# Patient Record
Sex: Male | Born: 1955 | Race: Black or African American | Hispanic: No | Marital: Married | Smoking: Never smoker
Health system: Southern US, Community
[De-identification: ages and names within clinical notes are randomized; demographics above are authoritative.]

## PROBLEM LIST (undated history)

## (undated) DIAGNOSIS — R32 Unspecified urinary incontinence: Secondary | ICD-10-CM

## (undated) HISTORY — DX: Unspecified urinary incontinence: R32

---

## 2004-05-22 ENCOUNTER — Emergency Department (HOSPITAL_COMMUNITY): Admission: EM | Admit: 2004-05-22 | Discharge: 2004-05-22 | Payer: Self-pay | Admitting: Family Medicine

## 2007-03-14 ENCOUNTER — Emergency Department (HOSPITAL_COMMUNITY): Admission: EM | Admit: 2007-03-14 | Discharge: 2007-03-14 | Payer: Self-pay | Admitting: Emergency Medicine

## 2007-03-18 HISTORY — PX: PROSTATE SURGERY: SHX751

## 2007-04-05 ENCOUNTER — Encounter (INDEPENDENT_AMBULATORY_CARE_PROVIDER_SITE_OTHER): Payer: Self-pay | Admitting: Urology

## 2007-04-06 ENCOUNTER — Inpatient Hospital Stay (HOSPITAL_COMMUNITY): Admission: AD | Admit: 2007-04-06 | Discharge: 2007-04-08 | Payer: Self-pay | Admitting: Urology

## 2008-11-18 ENCOUNTER — Emergency Department (HOSPITAL_COMMUNITY): Admission: EM | Admit: 2008-11-18 | Discharge: 2008-11-18 | Payer: Self-pay | Admitting: Emergency Medicine

## 2009-02-09 IMAGING — CT CT PELVIS W/O CM
1 of 2 series · 15 of 32 positions shown, 19 images · IV contrast (agent unspecified)
Comparison: none

CLINICAL DATA: 51-year-old with back pain and bladder infection.  
ABDOMEN CT WITHOUT CONTRAST:
TECHNIQUE: Multidetector CT imaging of the abdomen was performed following the standard protocol without IV contrast.
TECHNIQUE: Multidetector CT imaging of the pelvis was performed following the standard protocol without IV contrast.

[Series 2: abd_pel 5.0 b40f st · axial · 0.73mm/px · z∈[-484,-54]mm · 15 of 94 slices shown, 19 images]
[im 4/94  soft-tissue]
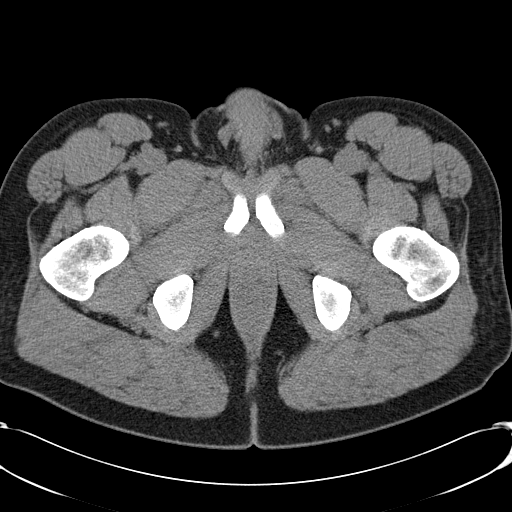
[im 4/94  bone]
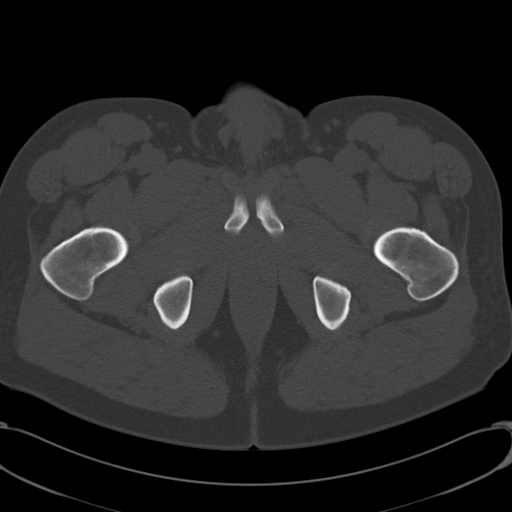
[im 11/94  soft-tissue]
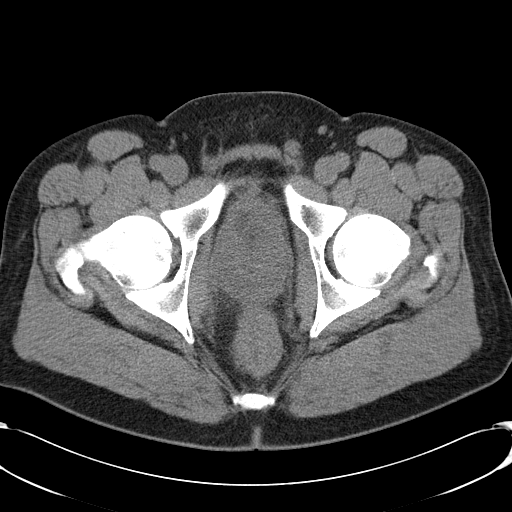
[im 18/94  soft-tissue]
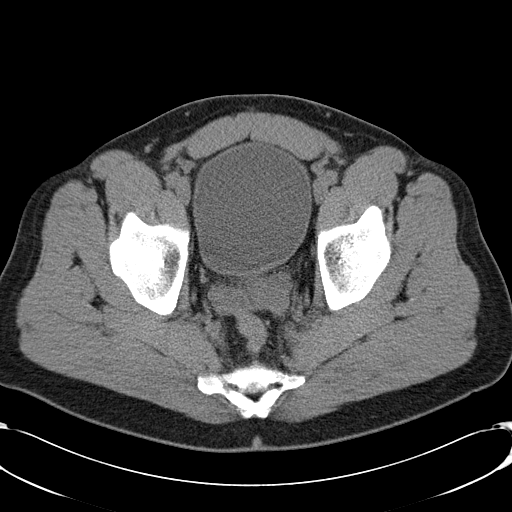
[im 26/94  soft-tissue]
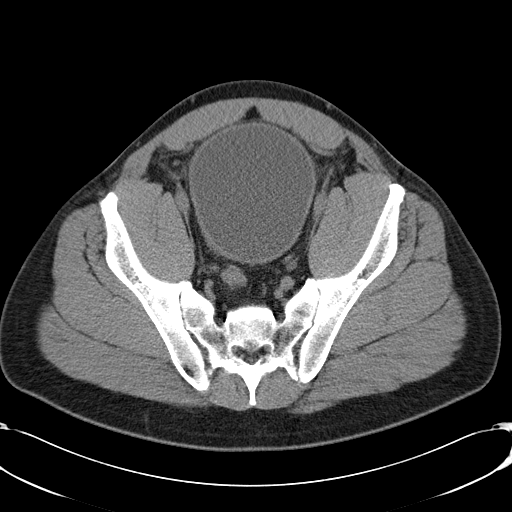
[im 33/94  soft-tissue]
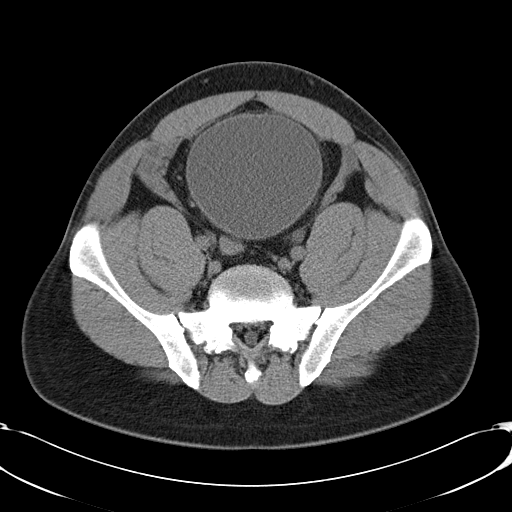
[im 40/94  soft-tissue]
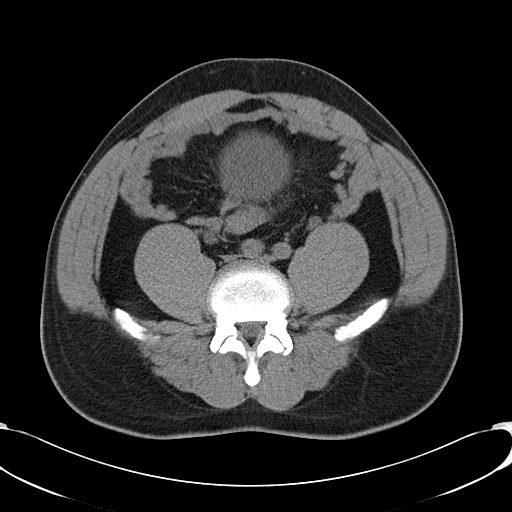
[im 47/94  soft-tissue]
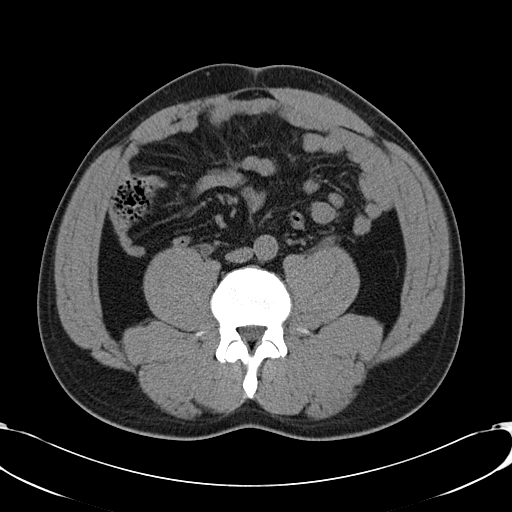
[im 54/94  soft-tissue]
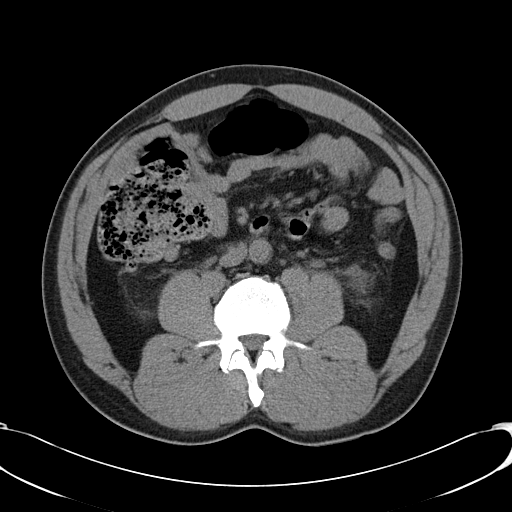
[im 61/94  soft-tissue]
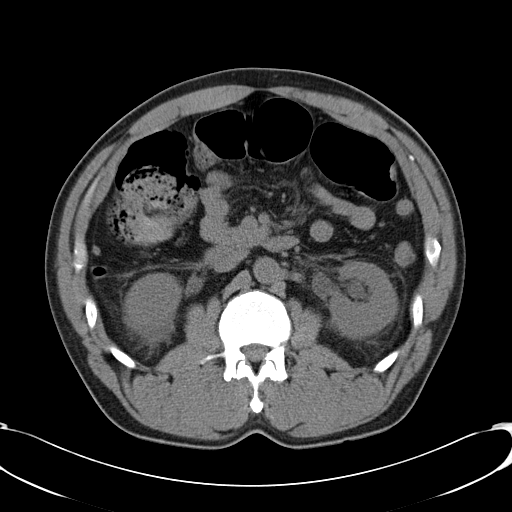
[im 61/94  bone]
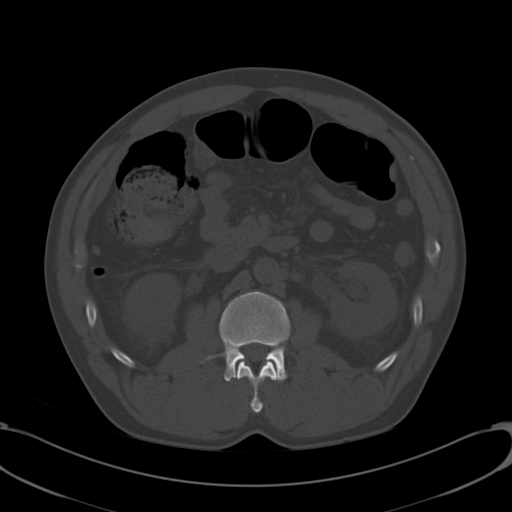
[im 68/94  soft-tissue]
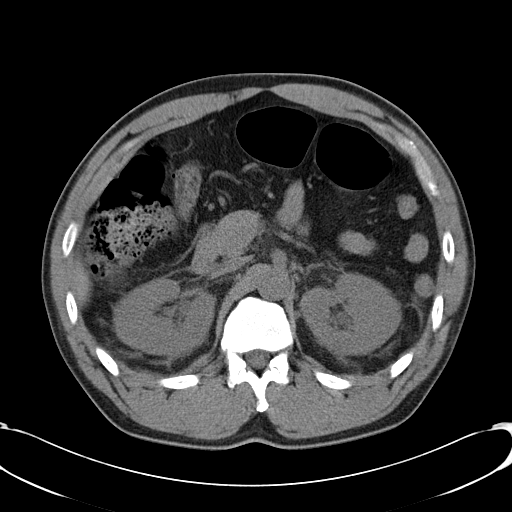
[im 76/94  soft-tissue]
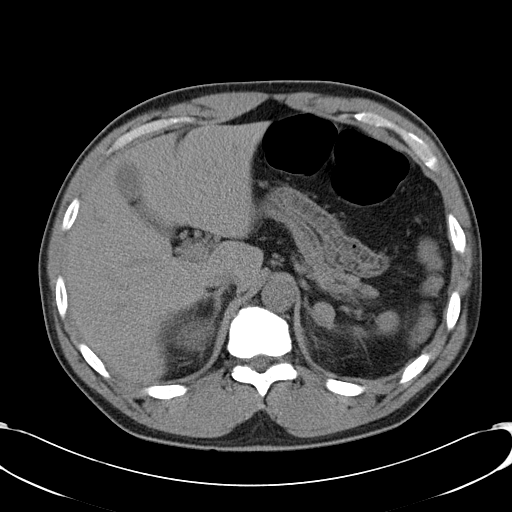
[im 79/94  lung]
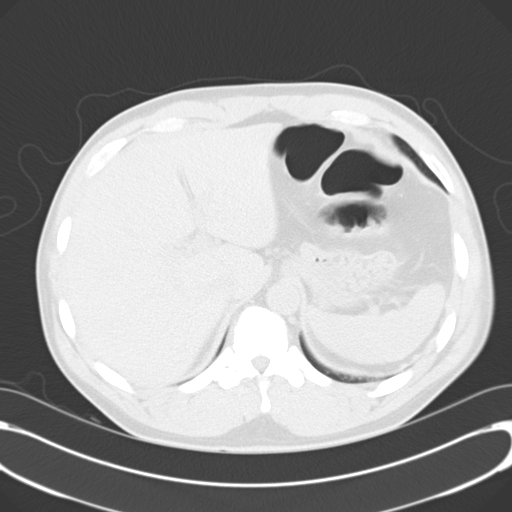
[im 83/94  soft-tissue]
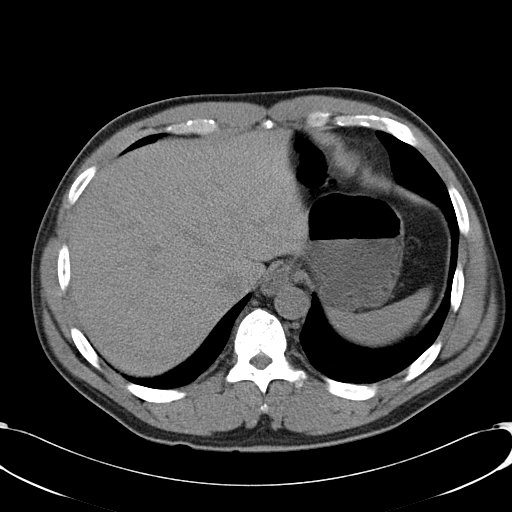
[im 83/94  lung]
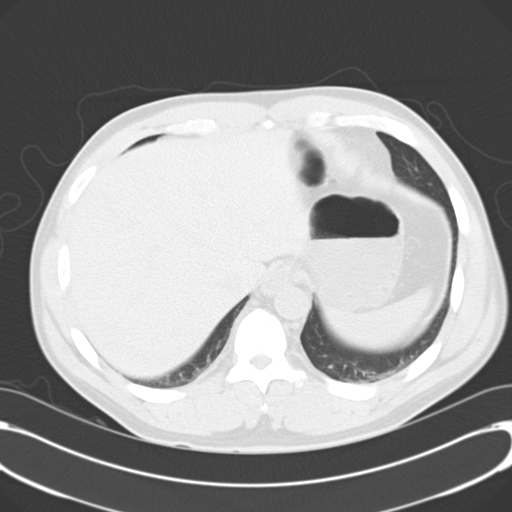
[im 86/94  lung]
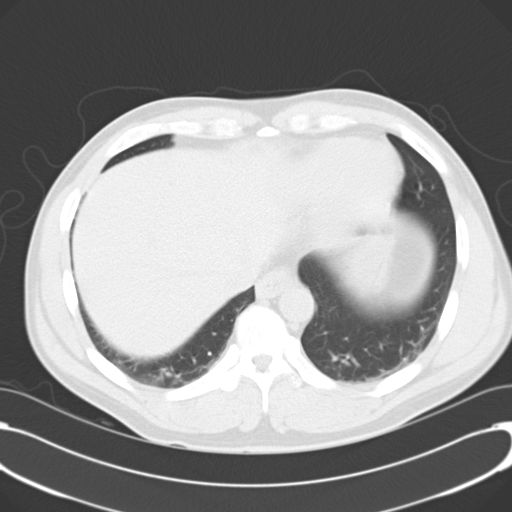
[im 90/94  soft-tissue]
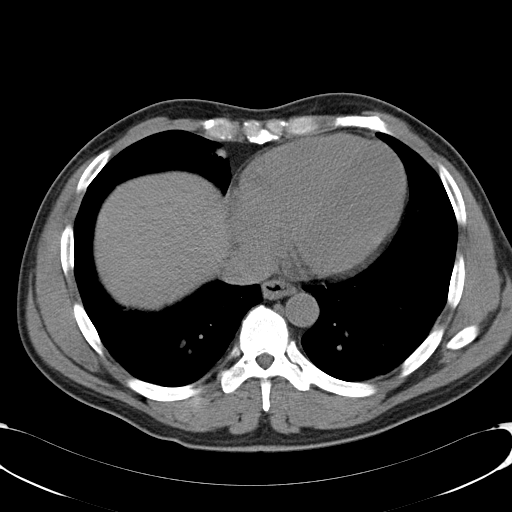
[im 90/94  lung]
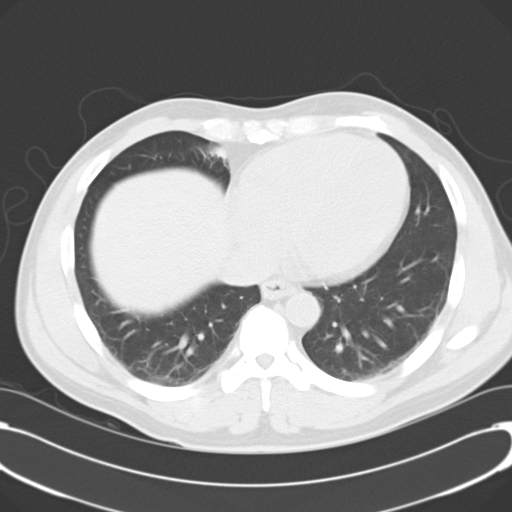

[15 of 32 positions shown; findings below may reference images not displayed]

FINDINGS: The lung bases are clear.  No evidence for free air.  The gallbladder, liver, spleen, stomach, and pancreas are within normal limits.  There is marked perinephric stranding bilaterally.   There is also mild dilatation of both ureters with periurethral stranding.  However, there are no definite renal stones.   Appendix has a normal morphology.  No significant free fluid or lymphadenopathy.  The urinary bladder is markedly distended and extends into the lower abdomen.  No acute bone abnormalities.
IMPRESSION: 1.  Bilateral perinephric stranding and mild hydroureteronephrosis associated with a distended urinary bladder. Findings are concerning for urinary retention and the patient may have secondary pyelonephritis. 
2.  No definite kidney stones 
PELVIS CT WITHOUT CONTRAST:
FINDINGS: Marked distention of the urinary bladder.  The prostate is very prominent measuring at least 5.4 cm in the axial dimension.  No significant free fluid.  No acute bone abnormalities.   There is sclerosis along the left side of the SI joint possibly degenerative in nature.
IMPRESSION: Distended urinary bladder concerning for bladder outlet obstruction possibly related to an enlarged prostate gland.

## 2010-06-21 LAB — POCT URINALYSIS DIP (DEVICE)
Hgb urine dipstick: NEGATIVE
Ketones, ur: NEGATIVE mg/dL
Specific Gravity, Urine: 1.02 (ref 1.005–1.030)
Urobilinogen, UA: 1 mg/dL (ref 0.0–1.0)
pH: 7 (ref 5.0–8.0)

## 2010-07-30 NOTE — H&P (Signed)
Tyler Austin, Tyler Austin              ACCOUNT NO.:  [id number]   MEDICAL RECORD NO.:  [id number]          PATIENT TYPE:  INP   LOCATION:  1436                         FACILITY:  [facility]   PHYSICIAN:  Martina Sinner, MD DATE OF BIRTH:  [birth year]   DATE OF ADMISSION:  04/05/2007  DATE OF DISCHARGE:                              HISTORY & PHYSICAL   ADMISSION DIAGNOSIS:  Urinary retention; admitted for postoperative pain  and bowel management.   HISTORY:  Mr. 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  On April 05, 2007, he underwent an open suprapubic prostatectomy by  Dr. Lorin Picket MacDiarmid and Dr. Marcine Matar.  Not surprising 1 day  after surgery, he was not passing gas and not ready for discharge home.  He was having some discomfort in the abdomen, but overall is doing  beautifully.   His serum creatinine when he first presented to the office was 1.6, but  his preoperative laboratory tests were within normal limits and his  serum creatinine is 1.2.   PAST MEDICAL HISTORY:  Healthy with no previous surgery.   MEDICATIONS:  He was treated with Levaquin prior to admission.   ALLERGIES:  NONE.   FAMILY HISTORY:  No history of prostate cancer.   SOCIAL HISTORY:  Works and lives locally.   REVIEW OF SYSTEMS:  Rest of the review of systems is negative.   PHYSICAL EXAMINATION:  GENERAL APPEARANCE:  Alert, well oriented, in no  distress.  RESPIRATORY:  Breaths quiet.  CARDIOVASCULAR:  Pulse regular.  ABDOMEN:  Soft and nontender.  GENITOURINARY:  Foley catheter draining to leg bag.  LYMPHATICS:  No palpable inguinal adenopathy.  NEUROLOGIC:  Normal sensation to touch.  MUSCULOSKELETAL:  Normal gait and arm mobility.  SKIN:  No obvious rashes.   IMPRESSION:  Benign prostatic hyperplasia with urinary retention and  bilateral hydroureter.  The patient had surgery on April 05, 2007 and  was  admitted on April 06, 2007 for one more day of observation to  treat pain, remove drain and for advancement of diet.  I suspect that  Mr. Franklin will be discharged home the following day.           ______________________________  Martina Sinner, MD  Electronically Signed     SAM/MEDQ  D:  04/06/2007  T:  04/06/2007  Job:  [id number]

## 2010-07-30 NOTE — Op Note (Signed)
Tyler Austin, Tyler Austin              ACCOUNT NO.:  [id number]   MEDICAL RECORD NO.:  [id number]          PATIENT TYPE:  OIB   LOCATION:  1436                         FACILITY:  [facility]   PHYSICIAN:  Martina Sinner, MD DATE OF BIRTH:  11/18/1955   DATE OF PROCEDURE:  04/05/2007  DATE OF DISCHARGE:                               OPERATIVE REPORT   PREOPERATIVE DIAGNOSIS:  Retention with bilateral hydronephrosis and  benign prostatic hyperplasia.   POSTOPERATIVE DIAGNOSIS:  Retention with bilateral hydronephrosis and  benign prostatic hyperplasia.   SURGERY:  Suprapubic prostatectomy plus insertion of suprapubic tube.   SURGEON:  Martina Sinner, MD.   ASSISTANTBertram Millard. Dahlstedt, M.D.   INDICATION:  Mr. Opara history has been previously well documented.  His serum creatinine has decreased from 1.6 to 1.2 which was excellent.  His preoperative laboratory tests demonstrated a PSA of 2.68.  I spoke  to Dr. Retta Diones and Dr. Vonita Moss as well as the patient in this regard.  We felt that there was enough evidence to support the benign nature of  this value.  This was discussed with Mr. Rotundo and the family and we  went ahead with the open prostatectomy.   DESCRIPTION OF PROCEDURE:  The patient was prepped and draped in the  usual fashion.  He had a long suprapubic area.  A low midline incision  was made from the symphysis pubis to approximately 1 inch from the  umbilicus.  I dissected down splitting the rectus muscle in the midline.  The retropubic space was entered.  A new 24-French Foley catheter was  placed into the bladder under sterile conditions and the bladder was  filled with saline elevating the bladder.  The bladder was opened in the  midline for approximately 3 inches and six 3-0 silk pop-off sutures were  utilized.  A Bookwalter retractor with a blade in the dome of the  bladder with 2 sponges was utilized to retract the bladder and identify  the adenoma.  The  adenoma was easily visualized at the bladder neck and  extending into the neck of the bladder.  The ureteral orifices were  approximately 2 cm from the bladder neck and were visualized throughout  the length of the case.   Using good countertraction, a circular incision was made with cautery,  allowing [place] to find the plane between the prostatic capsule and the  adenoma.  It took approximately 10 and possibly even 15 minutes to  carefully dissect out the adenoma.  He came out in 3 pieces.  At times  some Metzenbaum dissection and even cautery dissection was done in a  very controlled fashion to remove the adenoma at that its interface with  the prostatic capsule and tissue.  A pinch maneuver was used at the  urethra.  Fortunately, bleeding was minimal during this part of the  dissection.   After irrigating the prostatic fossa and using good retraction with a  sponge forceps and narrow malleable, we were very happy that all the  adenoma had been removed.  We were both a little bit surprised that  there was not as much tissue as what was identified on prostatic  ultrasound.  There was not a lot of bleeding in the bed of the prostatic  fossa, though I placed a figure-of-eight 2-0 Vicryl at 5 and 7 o'clock  and there is no question this improved further the hemostasis.  Some  bleeding in the midline was fulgurated.  The prostatic bed was almost  completely dry at this point.   A 24-French catheter was inserted in the bladder.  At the end of the  case approximately 40 mL of sterile water was instilled into the  balloon.   A 20-French suprapubic catheter was brought through a small incision and  through the rectus belly on the right side.  It was brought through the  right side of the bladder wall away from the midline incision.  A 2-0  Vicryl pursestring suture was utilized.  The bladder wall was  exceptionally thick, at least 1 cm or thicker in the midline.  Sterile  water 10 mL was  inserted into the suprapubic tube.   On the left side a similar procedure allowed [place] to bring through a 10-  [place] drain.  This was placed in the retropubic space.   The bladder was closed with running 2-0 Vicryl suture in a 2-layer  fashion.  We were very happy with the closure.  The bladder was very  dry.  I instilled saline through the urethral catheter and the closure  was watertight.   Sponge count was good, and therefore we closed the fascia with running  #1 PDS.  The skin was closed with staples.  A 2-0 silk was used for the  drain and for the suprapubic tube.   At the end of the case normal saline irrigation was hooked up to the  suprapubic catheter and it was draining nicely through the urethral  catheter.  Irrigation was also utilized to make certain all tubes were  draining well.  The drain was set in place.  The catheters were secured.  Blood loss was estimated be approximately 200 mL.  Mr. Pickard was very  stable.   I am very hopeful that Mr. Bisson will greatly benefit from this  operation and he will start voiding and that we can protect his upper  tracts long term.           ______________________________  Martina Sinner, MD  Electronically Signed     SAM/MEDQ  D:  04/05/2007  T:  04/06/2007  Job:  [id number]

## 2010-07-30 NOTE — Discharge Summary (Signed)
Tyler Austin, Tyler Austin              ACCOUNT NO.:  [id number]   MEDICAL RECORD NO.:  [id number]          PATIENT TYPE:  INP   LOCATION:  1436                         FACILITY:  [facility]   PHYSICIAN:  Martina Sinner, MD DATE OF BIRTH:  Dec 11, 1955   DATE OF ADMISSION:  04/05/2007  DATE OF DISCHARGE:  04/07/2007                               DISCHARGE SUMMARY   ADMISSION DIAGNOSIS:  Urinary retention with bilateral hydronephrosis  and urinary retention; admitted for postoperative pain.   HOSPITAL COURSE:  [name] had an open suprapubic  prostatectomy on April 05, 2007.  He was initially in the hospital for  23 hours observation.  Not surprisingly, he required another day in the  hospital for pain management and advancement of his diet.   [name] was slow to have a bowel movement.  He was finally given a  Dulcolax suppository.  He had a little bit of belching on day one, but  was eating breakfast on day number two post surgery.  He was afebrile.  His vitals were stable throughout his course.  He had little to no  postoperative bleeding.  His hemoglobin was stable.  His incision looked  great.  His abdomen was soft and flat.   On the day of discharge, I stopped his CBI.  We took out his IV.  The  suprapubic catheter was clamped off with a catheter plug.  He was given  a leg and night bag and instructions.  He was sent home with Levaquin  for 7 days, MiraLax and Colace on a p.r.n. basis, and Vicodin.  He will  also continue on his Flomax.   His hemoglobin was 11.1 on April 06, 2007.  His PSA was 2.14 on  April 05, 2007.  His serum creatinine was 1.2 and electrolytes were  normal on April 05, 2007.   DISCHARGE MEDICATIONS:  1. Flomax 0.4 mg daily.  2. MiraLax once daily p.r.n.  3. Colace 100 mg twice a day p.r.n.  4. Levaquin 750 mg a day x7 days.  (The patient had a prescription at      home).   Mr. Frith and I reviewed his postoperative activity.  One week  after  discharge, I will remove his Foley catheter and give him a trial of  voiding.  I will also remove his skin staples.           ______________________________  Martina Sinner, MD  Electronically Signed     SAM/MEDQ  D:  04/07/2007  T:  04/07/2007  Job:  [id number]

## 2010-12-05 LAB — CBC
HCT: 31.5 — ABNORMAL LOW
HCT: 34.9 — ABNORMAL LOW
Hemoglobin: 11.3 — ABNORMAL LOW
MCHC: 33.6
MCHC: 34.5
MCHC: 35.2
MCV: 88.1
Platelets: 427 — ABNORMAL HIGH
Platelets: 442 — ABNORMAL HIGH
RBC: 4.02 — ABNORMAL LOW
RDW: 15
WBC: 10.3

## 2010-12-05 LAB — TYPE AND SCREEN: Antibody Screen: NEGATIVE

## 2010-12-05 LAB — BASIC METABOLIC PANEL
BUN: 10
GFR calc Af Amer: >60
Glucose, Bld: 93
Sodium: 141

## 2010-12-05 LAB — PSA: PSA: 2.14

## 2010-12-05 LAB — GENTAMICIN LEVEL, RANDOM: Gentamicin Rm: 4.3

## 2010-12-05 LAB — PROTIME-INR: INR: 0.9

## 2010-12-20 LAB — URINE CULTURE
Colony Count: NO GROWTH
Culture: NO GROWTH

## 2010-12-20 LAB — DIFFERENTIAL
Lymphs Abs: 0.4 — ABNORMAL LOW
Monocytes Absolute: 0.6
Neutro Abs: 10.1 — ABNORMAL HIGH
Neutrophils Relative %: 90 — ABNORMAL HIGH

## 2010-12-20 LAB — URINALYSIS, ROUTINE W REFLEX MICROSCOPIC
Bilirubin Urine: NEGATIVE
Glucose, UA: NEGATIVE
Nitrite: POSITIVE — AB
Specific Gravity, Urine: 1.016
Urobilinogen, UA: 1
pH: 5

## 2010-12-20 LAB — CBC
Hemoglobin: 13.5
WBC: 11.1 — ABNORMAL HIGH

## 2010-12-20 LAB — COMPREHENSIVE METABOLIC PANEL
AST: 28
Albumin: 4.2
Alkaline Phosphatase: 56
BUN: 15
CO2: 26
Creatinine, Ser: 1.61 — ABNORMAL HIGH
GFR calc non Af Amer: 46 — ABNORMAL LOW
Glucose, Bld: 124 — ABNORMAL HIGH
Total Bilirubin: 1

## 2011-08-25 ENCOUNTER — Ambulatory Visit (INDEPENDENT_AMBULATORY_CARE_PROVIDER_SITE_OTHER): Payer: BC Managed Care – PPO | Admitting: Family

## 2011-08-25 ENCOUNTER — Encounter: Payer: Self-pay | Admitting: Family

## 2011-08-25 VITALS — BP 120/90 | HR 78 | Ht 74.0 in | Wt 212.0 lb

## 2011-08-25 DIAGNOSIS — Z23 Encounter for immunization: Secondary | ICD-10-CM

## 2011-08-25 DIAGNOSIS — Z0000 Encounter for general adult medical examination without abnormal findings: Secondary | ICD-10-CM

## 2011-08-25 LAB — LIPID PANEL
Cholesterol: 164 mg/dL (ref 0–200)
LDL Cholesterol: 111 mg/dL — ABNORMAL HIGH (ref 0–99)
Total CHOL/HDL Ratio: 4
Triglycerides: 44 mg/dL (ref 0.0–149.0)
VLDL: 8.8 mg/dL (ref 0.0–40.0)

## 2011-08-25 LAB — BASIC METABOLIC PANEL
GFR: 92.25 mL/min (ref >60.00)
Potassium: 4.1 mEq/L (ref 3.5–5.1)

## 2011-08-25 LAB — POCT URINALYSIS DIPSTICK
Bilirubin, UA: NEGATIVE
Ketones, UA: NEGATIVE
Protein, UA: NEGATIVE
pH, UA: 5.5

## 2011-08-25 LAB — CBC WITH DIFFERENTIAL/PLATELET
Basophils Absolute: 0 10*3/uL (ref 0.0–0.1)
Eosinophils Relative: 3.7 % (ref 0.0–5.0)
HCT: 40 % (ref 39.0–52.0)
Hemoglobin: 13.4 g/dL (ref 13.0–17.0)
Lymphocytes Relative: 30.4 % (ref 12.0–46.0)
MCHC: 33.3 g/dL (ref 30.0–36.0)
Monocytes Absolute: 0.3 10*3/uL (ref 0.1–1.0)
Neutro Abs: 2.4 10*3/uL (ref 1.4–7.7)
Neutrophils Relative %: 57.8 % (ref 43.0–77.0)
WBC: 4.2 10*3/uL — ABNORMAL LOW (ref 4.5–10.5)

## 2011-08-25 NOTE — Patient Instructions (Signed)
Exercise to Stay Healthy  Exercise helps you become and stay healthy.    EXERCISE IDEAS AND TIPS  Choose exercises that:   You enjoy.   Fit into your day.  You do not need to exercise really hard to be healthy. You can do exercises at a slow or medium level and stay healthy. You can:   Stretch before and after working out.   Try yoga, Pilates, or tai chi.   Lift weights.   Walk fast, swim, jog, run, climb stairs, bicycle, dance, or rollerskate.   Take aerobic classes.    Exercises that burn about 150 calories:     Running 1  miles in 15 minutes.   Playing volleyball for 45 to 60 minutes.   Washing and waxing a car for 45 to 60 minutes.   Playing touch football for 45 minutes.   Walking 1  miles in 35 minutes.   Pushing a stroller 1  miles in 30 minutes.   Playing basketball for 30 minutes.   Raking leaves for 30 minutes.   Bicycling 5 miles in 30 minutes.   Walking 2 miles in 30 minutes.   Dancing for 30 minutes.   Shoveling snow for 15 minutes.   Swimming laps for 20 minutes.   Walking up stairs for 15 minutes.   Bicycling 4 miles in 15 minutes.   Gardening for 30 to 45 minutes.   Jumping rope for 15 minutes.   Washing windows or floors for 45 to 60 minutes.  Document Released: 04/05/2010 Document Revised: 02/20/2011 Document Reviewed: 04/05/2010  [employer] Patient Information 2012 [employer].

## 2011-08-25 NOTE — Progress Notes (Signed)
  Subjective:    Patient ID: Tyler Austin, male    DOB: 07-15-1955, 56 y.o.   MRN: [id number]  HPI  Patient presents for yearly preventative medicine examination. All immunizations and health maintenance protocols were reviewed with the patient and they are up to date with these protocols. Screening laboratory values were reviewed with the patient including screening of hyperlipidemia PSA renal function and hepatic function.There medications past medical history social history problem list and allergies were reviewed in detail. Goals were established with regard to weight loss exercise diet in compliance with medications   Review of Systems  Constitutional: Negative.   HENT: Negative.   Eyes: Negative.   Respiratory: Negative.   Cardiovascular: Negative.   Gastrointestinal: Negative.   Genitourinary: Negative.   Musculoskeletal: Negative.   Neurological: Negative.   Hematological: Negative.   Psychiatric/Behavioral: Negative.    Past Medical History  Diagnosis Date  . Urinary incontinence     History   Social History  . Marital Status: Married    Spouse Name: N/A    Number of Children: N/A  . Years of Education: N/A   Occupational History  . Not on file.   Social History Main Topics  . Smoking status: Never Smoker   . Smokeless tobacco: Not on file  . Alcohol Use: No  . Drug Use: No  . Sexually Active: Not on file   Other Topics Concern  . Not on file   Social History Narrative  . No narrative on file    Past Surgical History  Procedure Date  . Prostate surgery 2009    Family History  Problem Relation Age of Onset  . Hypertension Mother     No Known Allergies  Current Outpatient Prescriptions on File Prior to Visit  Medication Sig Dispense Refill  . calcium carbonate (OS-CAL) 600 MG TABS Take 600 mg by mouth daily.        BP 120/90  Pulse 78  Ht 6\' 2"  (1.88 m)  Wt 212 lb (96.163 kg)  BMI 27.22 kg/m2  SpO2 98%chart    Objective:   Physical  Exam  Constitutional: He is oriented to person, place, and time. He appears well-developed and well-nourished.  HENT:  Head: Normocephalic and atraumatic.  Right Ear: External ear normal.  Left Ear: External ear normal.  Nose: Nose normal.  Mouth/Throat: Oropharynx is clear and moist.  Eyes: Conjunctivae and EOM are normal. Pupils are equal, round, and reactive to light.  Neck: Normal range of motion. Neck supple.  Cardiovascular: Normal rate, regular rhythm, normal heart sounds and intact distal pulses.   Pulmonary/Chest: Effort normal and breath sounds normal.  Abdominal: Soft. Bowel sounds are normal.  Genitourinary: Rectum normal and penis normal. Guaiac negative stool. No penile tenderness.  Musculoskeletal: Normal range of motion.  Neurological: He is alert and oriented to person, place, and time. He has normal reflexes.  Skin: Skin is warm and dry.  Psychiatric: He has a normal mood and affect.          Assessment & Plan:  Assessment: CPX  Plan: Labs sent to include BMP, CBC, PSA,lipids, will notify patient pending results. Healthy diet and exercise. Continue vitamin supplements.

## 2011-09-01 ENCOUNTER — Encounter: Payer: Self-pay | Admitting: Internal Medicine

## 2011-09-30 ENCOUNTER — Ambulatory Visit (AMBULATORY_SURGERY_CENTER): Payer: BC Managed Care – PPO

## 2011-09-30 ENCOUNTER — Encounter: Payer: Self-pay | Admitting: Internal Medicine

## 2011-09-30 VITALS — Ht 74.0 in | Wt 210.4 lb

## 2011-09-30 DIAGNOSIS — Z1211 Encounter for screening for malignant neoplasm of colon: Secondary | ICD-10-CM

## 2011-09-30 MED ORDER — MOVIPREP 100 G PO SOLR: ORAL | Status: DC

## 2011-10-14 ENCOUNTER — Ambulatory Visit (AMBULATORY_SURGERY_CENTER): Payer: BC Managed Care – PPO | Admitting: Internal Medicine

## 2011-10-14 ENCOUNTER — Encounter: Payer: Self-pay | Admitting: Internal Medicine

## 2011-10-14 VITALS — BP 126/83 | HR 48 | Temp 97.0°F | Resp 20 | Ht 74.0 in | Wt 212.0 lb

## 2011-10-14 DIAGNOSIS — Z1211 Encounter for screening for malignant neoplasm of colon: Secondary | ICD-10-CM

## 2011-10-14 MED ORDER — SODIUM CHLORIDE 0.9 % IV SOLN: 500.0000 mL | INTRAVENOUS | Status: DC

## 2011-10-14 NOTE — Progress Notes (Signed)
Pt's heart rate in the 40's before procedure begins.  He states he plays basketball several times a week.  Robinul 0.2mg  IV given per [facility] CRNA  Propofol given and oxygen managed per [facility] CRNA

## 2011-10-14 NOTE — Progress Notes (Signed)
Patient did not experience any of the following events: a burn prior to discharge; a fall within the facility; wrong site/side/patient/procedure/implant event; or a hospital transfer or hospital admission upon discharge from the facility. (G8907) Patient did not have preoperative order for IV antibiotic SSI prophylaxis. (G8918)  

## 2011-10-14 NOTE — Op Note (Signed)
[facility] [address] [place], [place]  [address]  COLONOSCOPY PROCEDURE REPORT  PATIENT:  Tyler, Austin  MR#:  [id number] BIRTHDATE:  November 02, 1955, 56 yrs. old  GENDER:  male ENDOSCOPIST:  Iva Boop, MD, [facility] REF BY:          Adline Mango, NP PROCEDURE DATE:  10/14/2011 PROCEDURE:  Colonoscopy 62130 ASA CLASS:  Class I INDICATIONS:  Routine Risk Screening MEDICATIONS:   These medications were titrated to patient response per physician's verbal order, MAC sedation, administered by CRNA, propofol (Diprivan) 400 mg IV  DESCRIPTION OF PROCEDURE:   After the risks benefits and alternatives of the procedure were thoroughly explained, informed consent was obtained.  Digital rectal exam was performed and revealed no rectal masses and that the prostate was surgically absent.   The LB CF-[id number] endoscope was introduced through the anus and advanced to the cecum, which was identified by both the appendix and ileocecal valve, without limitations. The quality of the prep was adequate, using MoviPrep.  The instrument was then slowly withdrawn as the colon was fully examined. <<PROCEDUREIMAGES>>  FINDINGS:  A normal appearing cecum, ileocecal valve, and appendiceal orifice were identified. The ascending, hepatic flexure, transverse, splenic flexure, descending, sigmoid colon, and rectum appeared unremarkable.   Retroflexed views in the rectum revealed no abnormalities.    The time to cecum = 3:55 minutes. The scope was then withdrawn in 12:53 minutes from the cecum and the procedure completed. COMPLICATIONS:  None ENDOSCOPIC IMPRESSION: 1) Normal colonoscopy RECOMMENDATIONS:  REPEAT EXAM:  In 10 year(s) for routine screening colonoscopy.  Iva Boop, MD, [facility]  CC:  Adline Mango NP and The Patient  n. eSIGNED:   Iva Boop at 10/14/2011 09:31 AM  Chrys Racer, [phone]

## 2011-10-14 NOTE — Patient Instructions (Addendum)
The colon was normal! No polyps were found.   Thank you for choosing me and [facility].  Iva Boop, MD, FACG  YOU HAD AN ENDOSCOPIC PROCEDURE TODAY AT THE [facility]: Refer to the procedure report that was given to you for any specific questions about what was found during the examination.  If the procedure report does not answer your questions, please call your gastroenterologist to clarify.  If you requested that your care partner not be given the details of your procedure findings, then the procedure report has been included in a sealed envelope for you to review at your convenience later.  YOU SHOULD EXPECT: Some feelings of bloating in the abdomen. Passage of more gas than usual.  Walking can help get rid of the air that was put into your GI tract during the procedure and reduce the bloating. If you had a lower endoscopy (such as a colonoscopy or flexible sigmoidoscopy) you may notice spotting of blood in your stool or on the toilet paper. If you underwent a bowel prep for your procedure, then you may not have a normal bowel movement for a few days.  DIET: Your first meal following the procedure should be a light meal and then it is ok to progress to your normal diet.  A half-sandwich or bowl of soup is an example of a good first meal.  Heavy or fried foods are harder to digest and may make you feel nauseous or bloated.  Likewise meals heavy in dairy and vegetables can cause extra gas to form and this can also increase the bloating.  Drink plenty of fluids but you should avoid alcoholic beverages for 24 hours.  ACTIVITY: Your care partner should take you home directly after the procedure.  You should plan to take it easy, moving slowly for the rest of the day.  You can resume normal activity the day after the procedure however you should NOT DRIVE or use heavy machinery for 24 hours (because of the sedation medicines used during the test).    SYMPTOMS TO REPORT  IMMEDIATELY: A gastroenterologist can be reached at any hour.  During normal business hours, 8:30 AM to 5:00 PM Monday through Friday, call [phone].  After hours and on weekends, please call the GI answering service at [phone] who will take a message and have the physician on call contact you.   Following lower endoscopy (colonoscopy or flexible sigmoidoscopy):  Excessive amounts of blood in the stool  Significant tenderness or worsening of abdominal pains  Swelling of the abdomen that is new, acute  Fever of 100F or highER   FOLLOW UP: If any biopsies were taken you will be contacted by phone or by letter within the next 1-3 weeks.  Call your gastroenterologist if you have not heard about the biopsies in 3 weeks.  Our staff will call the home number listed on your records the next business day following your procedure to check on you and address any questions or concerns that you may have at that time regarding the information given to you following your procedure. This is a courtesy call and so if there is no answer at the home number and we have not heard from you through the emergency physician on call, we will assume that you have returned to your regular daily activities without incident.  SIGNATURES/CONFIDENTIALITY: You and/or your care partner have signed paperwork which will be entered into your electronic medical record.  These signatures attest to  the fact that that the information above on your After Visit Summary has been reviewed and is understood.  Full responsibility of the confidentiality of this discharge information lies with you and/or your care-partner.   NORMAL EXAM TODAY. REPEAT EXAM IN 10 YEARS-2023

## 2011-10-15 ENCOUNTER — Telehealth: Payer: Self-pay | Admitting: *Deleted

## 2011-10-15 NOTE — Telephone Encounter (Signed)
  Follow up Call-  Call back number 10/14/2011  Post procedure Call Back phone  # [phone]  Permission to leave phone message Yes     Patient questions:  Do you have a fever, pain , or abdominal swelling? no Pain Score  0 *  Have you tolerated food without any problems? yes  Have you been able to return to your normal activities? yes  Do you have any questions about your discharge instructions: Diet   no Medications  no Follow up visit  no  Do you have questions or concerns about your Care? no  Actions: * If pain score is 4 or above: No action needed, pain <4.

## 2015-07-15 ENCOUNTER — Emergency Department (HOSPITAL_COMMUNITY)
Admission: EM | Admit: 2015-07-15 | Discharge: 2015-07-15 | Disposition: A | Discharge status: Home / Self Care | Payer: Self-pay | Attending: Emergency Medicine | Admitting: Emergency Medicine

## 2015-07-15 ENCOUNTER — Emergency Department (HOSPITAL_COMMUNITY): Payer: Self-pay

## 2015-07-15 ENCOUNTER — Encounter (HOSPITAL_COMMUNITY): Payer: Self-pay | Admitting: *Deleted

## 2015-07-15 DIAGNOSIS — W2105XA Struck by basketball, initial encounter: Secondary | ICD-10-CM | POA: Insufficient documentation

## 2015-07-15 DIAGNOSIS — Y998 Other external cause status: Secondary | ICD-10-CM | POA: Insufficient documentation

## 2015-07-15 DIAGNOSIS — T148XXA Other injury of unspecified body region, initial encounter: Secondary | ICD-10-CM

## 2015-07-15 DIAGNOSIS — Z79899 Other long term (current) drug therapy: Secondary | ICD-10-CM | POA: Insufficient documentation

## 2015-07-15 DIAGNOSIS — Y92310 Basketball court as the place of occurrence of the external cause: Secondary | ICD-10-CM | POA: Insufficient documentation

## 2015-07-15 DIAGNOSIS — S62617A Displaced fracture of proximal phalanx of left little finger, initial encounter for closed fracture: Secondary | ICD-10-CM | POA: Insufficient documentation

## 2015-07-15 DIAGNOSIS — Y9367 Activity, basketball: Secondary | ICD-10-CM | POA: Insufficient documentation

## 2015-07-15 MED ORDER — LIDOCAINE HCL 2 % IJ SOLN
10.0000 mL | Freq: Once | INTRAMUSCULAR | Status: AC
  Administered 2015-07-15: 200 mg
  Filled 2015-07-15: qty 20

## 2015-07-15 MED ORDER — OXYCODONE-ACETAMINOPHEN 5-325 MG PO TABS: 1.0000 | ORAL_TABLET | Freq: Four times a day (QID) | ORAL | Status: DC | PRN

## 2015-07-15 NOTE — ED Notes (Signed)
Pt is here with left pinkie finger swelling from a basketball injury today.  Pt can bend it

## 2015-07-15 NOTE — ED Provider Notes (Signed)
CSN: [id number]     Arrival date & time 07/15/15  1205 History  By signing my name below, I, Soijett Blue, attest that this documentation has been prepared under the direction and in the presence of S. Lane Hacker, PA-C Electronically Signed: Soijett Blue, ED Scribe. 07/15/2015. 1:02 PM.    Chief Complaint  Patient presents with  . Finger Injury      The history is provided by the patient. No language interpreter was used.    Tyler Austin is a 60 y.o. male who presents to the Emergency Department complaining of left little finger injury occurring today 1.5 hours PTA. Pt states that he was playing basketball when he caught the ball and it struck his left little finger. Pt reports that he had another individual to attempt to pull the finger back into place with no success. Pt denies pain at this time. Pt is having associated symptoms of left little swelling. He notes that he has tried ice without any medications for the relief of his symptoms. He denies color change, wound, rash, numbness, tingling, and any other symptoms.    Past Medical History  Diagnosis Date  . Urinary incontinence    Past Surgical History  Procedure Laterality Date  . Prostate surgery  2009   Family History  Problem Relation Age of Onset  . Hypertension Mother   . Breast cancer Sister    Social History  Substance Use Topics  . Smoking status: Never Smoker   . Smokeless tobacco: Never Used  . Alcohol Use: No    Review of Systems  A complete 10 system review of systems was obtained and all systems are negative except as noted in the HPI and PMH.   Allergies  Review of patient's allergies indicates no known allergies.  Home Medications   Prior to Admission medications   Medication Sig Start Date End Date Taking? Authorizing Provider  calcium carbonate (OS-CAL) 600 MG TABS Take 600 mg by mouth daily.    Historical Provider, MD  glucosamine-chondroitin 500-400 MG tablet Take 1 tablet by mouth daily.     Historical Provider, MD  Multiple Vitamin (MULTIVITAMIN) tablet Take 1 tablet by mouth daily.    Historical Provider, MD  vitamin C (ASCORBIC ACID) 500 MG tablet Take 500 mg by mouth daily.    Historical Provider, MD  vitamin E 400 UNIT capsule Take 400 Units by mouth daily.    Historical Provider, MD   BP 153/105 mmHg  Pulse 82  Temp(Src) 99.2 F (37.3 C) (Oral)  Resp 16  SpO2 99% Physical Exam  Constitutional: He is oriented to person, place, and time. He appears well-developed and well-nourished. No distress.  HENT:  Head: Normocephalic and atraumatic.  Eyes: EOM are normal.  Neck: Neck supple.  Cardiovascular: Normal rate.   Pulmonary/Chest: Effort normal. No respiratory distress.  Abdominal: He exhibits no distension.  Musculoskeletal: Normal range of motion.  Left pinky: Obvious deformity. Edema. Decreased flexion at left pinky. NVI.  Neurological: He is alert and oriented to person, place, and time.  Skin: Skin is warm and dry.  Psychiatric: He has a normal mood and affect. His behavior is normal.  Nursing note and vitals reviewed.   ED Course  Procedures (including critical care time) DIAGNOSTIC STUDIES: Oxygen Saturation is 99% on RA, nl by my interpretation.    COORDINATION OF CARE: 1:01 PM Discussed treatment plan with pt at bedside which includes left little finger xray and pt agreed to plan.    Labs  Review Labs Reviewed - No data to display  Imaging Review Dg Finger Little Left  07/15/2015  CLINICAL DATA:  Injury left fifth finger today playing basketball EXAM: LEFT LITTLE FINGER 2+V COMPARISON:  None. FINDINGS: Oblique moderately displaced fracture proximal phalanx through the proximal shaft. Distal fracture fragment is displaced about half shafts with in a radial direction. There is also apex radial angulation. 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 IMPRESSION: Acute fracture proximal fifth phalanx Electronically Signed   By:  Esperanza Heiraymond  Rubner M.D.   On: 07/15/2015 14:02   I have personally reviewed and evaluated these images as part of my medical decision-making.  MDM   Final diagnoses:  Fracture   Patient X-Ray positive for acute fracture proximal fifth phalanx. Pt advised to follow up with orthopedics. Patient given finger splint and buddy tape while in ED, conservative therapy recommended and discussed. Patient will be discharged home & is agreeable with above plan. Returns precautions discussed. Pt appears safe for discharge.  I personally performed the services described in this documentation, which was scribed in my presence. The recorded information has been reviewed and is accurate.    Melton KrebsSamantha Nicole Destry Bezdek, PA-C 07/20/15 1719  Marily MemosJason Mesner, MD 07/21/15 [phone]

## 2015-07-15 NOTE — Discharge Instructions (Signed)
Mr. Chrys Racerlphonso Lince,  Nice meeting you! Please follow-up with Dr. Orlan Leavensrtman, [facility]. Return to the emergency department if you develop numbness/tingling, increased swelling. Feel better soon!  S. Lane HackerNicole Duvan Mousel, PA-C

## 2015-07-15 NOTE — Progress Notes (Signed)
Orthopedic Tech Progress Note Patient Details:  Chrys Racerlphonso Lobue 1955-09-28 [id number]  Ortho Devices Type of Ortho Device: Finger splint Ortho Device/Splint Location: LUE Ortho Device/Splint Interventions: Ordered, Application   Jennye MoccasinHughes, Leonid Manus Craig 07/15/2015, 3:19 PM

## 2015-07-15 NOTE — ED Notes (Signed)
Declined W/C at D/C and was escorted to lobby by RN. 

## 2017-06-12 IMAGING — DX DG FINGER LITTLE 2+V*L*
3 series · 3 of 3 positions shown · non-contrast
Comparison: None.

CLINICAL DATA: Injury left fifth finger today playing basketball

EXAM:
LEFT LITTLE FINGER 2+V

[finger ap]
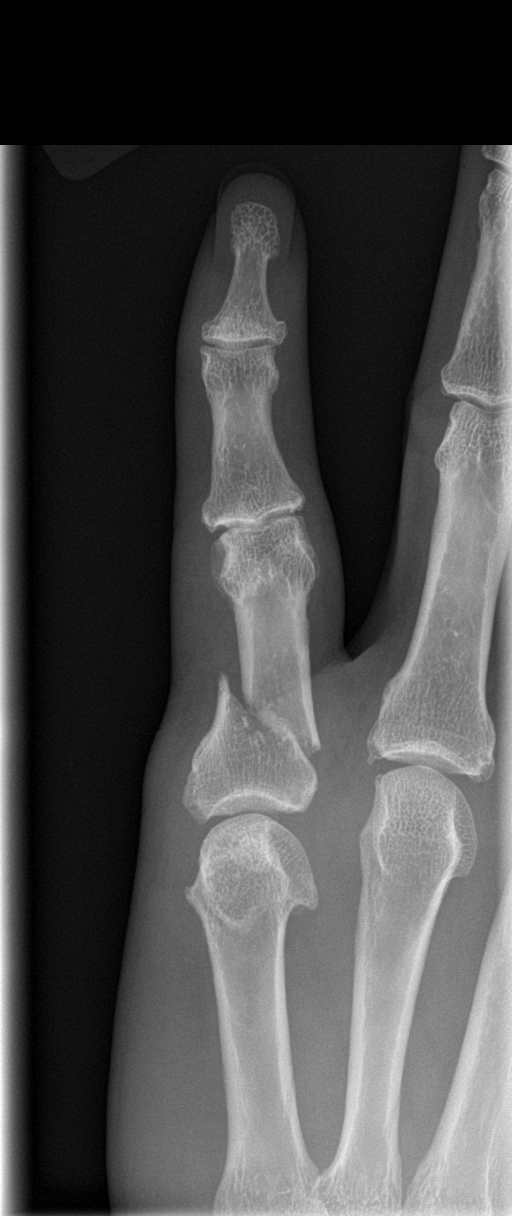

[finger obl]
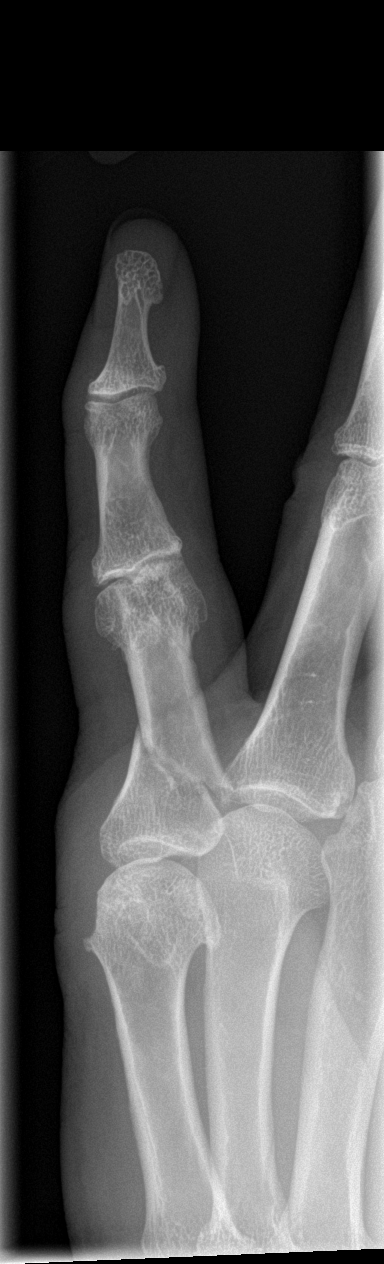

[finger lat]
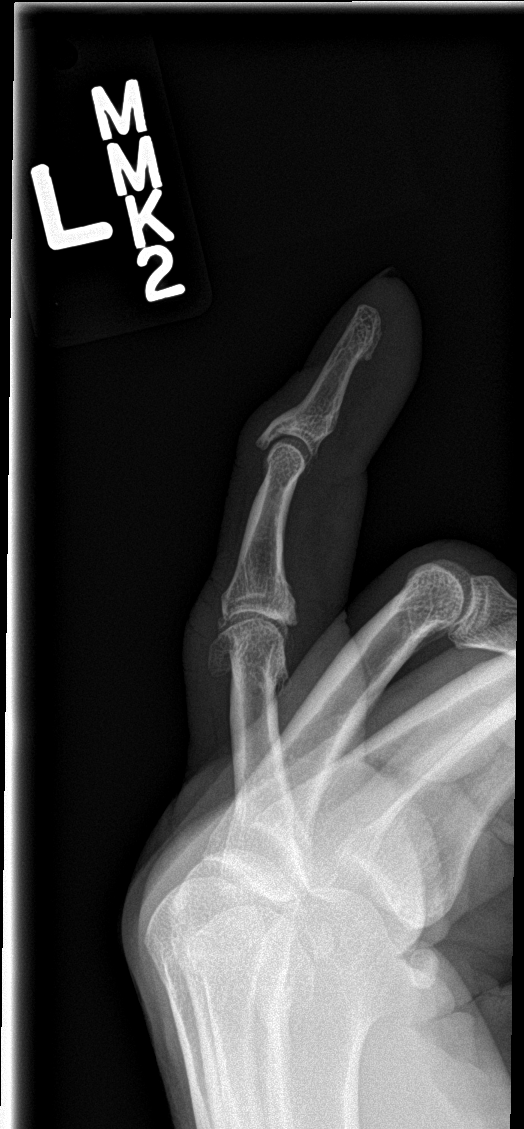

[3 of 3 positions shown; findings below may reference images not displayed]

FINDINGS: Oblique moderately displaced fracture proximal phalanx through the
proximal shaft. Distal fracture fragment is displaced about half
shafts with in a radial direction. There is also apex radial
angulation. There is arthritis of the proximal and distal
interphalangeal joints.
IMPRESSION: Acute fracture proximal fifth phalanx

## 2020-08-31 ENCOUNTER — Encounter: Payer: Self-pay | Admitting: Nurse Practitioner

## 2020-08-31 ENCOUNTER — Other Ambulatory Visit: Payer: Self-pay

## 2020-08-31 ENCOUNTER — Ambulatory Visit (INDEPENDENT_AMBULATORY_CARE_PROVIDER_SITE_OTHER): Payer: 59 | Admitting: Nurse Practitioner

## 2020-08-31 VITALS — BP 136/70 | HR 64 | Temp 98.1°F | Ht 74.0 in | Wt 218.8 lb

## 2020-08-31 DIAGNOSIS — Z131 Encounter for screening for diabetes mellitus: Secondary | ICD-10-CM

## 2020-08-31 DIAGNOSIS — Z7689 Persons encountering health services in other specified circumstances: Secondary | ICD-10-CM

## 2020-08-31 DIAGNOSIS — Z13220 Encounter for screening for lipoid disorders: Secondary | ICD-10-CM | POA: Diagnosis not present

## 2020-08-31 DIAGNOSIS — Z13228 Encounter for screening for other metabolic disorders: Secondary | ICD-10-CM | POA: Diagnosis not present

## 2020-08-31 DIAGNOSIS — Z0000 Encounter for general adult medical examination without abnormal findings: Secondary | ICD-10-CM

## 2020-08-31 DIAGNOSIS — Z125 Encounter for screening for malignant neoplasm of prostate: Secondary | ICD-10-CM

## 2020-08-31 NOTE — Progress Notes (Signed)
Subjective:    Patient ID: Tyler Austin, male    DOB: September 27, 1955, 65 y.o.   MRN: [id number]  HPI: Tyler Austin is a 65 y.o. male presenting for new patient visit to establish care.  Introduced to Publishing rights manager role and practice setting.  All questions answered.  Discussed provider/patient relationship and expectations.   Chief Complaint  Patient presents with   Establish Care   Patient has no medical concerns or complaints.  He is here fasting today.  Has 2 kids; 3 grandchildren and 1 great grandchild.  History of prostate cancer-thinks it may have been resected.  No prostate symptoms today including no frequency at nighttime, difficulty starting stream, or incomplete voiding.  Physical activity - basketball 2 days per week; used to walk .  Dietary -eats once per day.  Lately eating more salads, chicken, pork chops.  Prepares food at home.  Likes ice cream but does not eat a lot.  Drinks mostly juice throughout the day.  Does not drink much water.  No Known Allergies  Outpatient Encounter Medications as of 08/31/2020  Medication Sig   calcium carbonate (OS-CAL) 600 MG TABS Take 600 mg by mouth daily.   glucosamine-chondroitin 500-400 MG tablet Take 1 tablet by mouth daily.   Multiple Vitamin (MULTIVITAMIN) tablet Take 1 tablet by mouth daily.   vitamin C (ASCORBIC ACID) 500 MG tablet Take 500 mg by mouth daily.   vitamin E 400 UNIT capsule Take 400 Units by mouth daily.   [DISCONTINUED] oxyCODONE-acetaminophen (PERCOCET/ROXICET) 5-325 MG tablet Take 1-2 tablets by mouth every 6 (six) hours as needed for severe pain.   No facility-administered encounter medications on file as of 08/31/2020.   Active Ambulatory Problems    Diagnosis Date Noted   No Active Ambulatory Problems   Resolved Ambulatory Problems    Diagnosis Date Noted   No Resolved Ambulatory Problems   Past Medical History:  Diagnosis Date   Urinary incontinence    Past Medical History:  Diagnosis  Date   Urinary incontinence    Past Surgical History:  Procedure Laterality Date   PROSTATE SURGERY  2009   Family History  Problem Relation Age of Onset   Hypertension Mother    Syncope episode Father    Breast cancer Sister    Social History   Tobacco Use   Smoking status: Never   Smokeless tobacco: Never  Substance Use Topics   Alcohol use: No   Drug use: No   Review of Systems  Constitutional: Negative.   HENT: Negative.    Eyes: Negative.   Respiratory: Negative.    Cardiovascular: Negative.   Gastrointestinal: Negative.   Genitourinary: Negative.   Musculoskeletal: Negative.   Skin: Negative.   Neurological: Negative.   Hematological: Negative.   Psychiatric/Behavioral: Negative.    Per HPI unless specifically indicated above     Objective:    BP 136/70   Pulse 64   Temp 98.1 F (36.7 C)   Ht 6\' 2"  (1.88 m)   Wt 218 lb 12.8 oz (99.2 kg)   SpO2 96%   BMI 28.09 kg/m   Wt Readings from Last 3 Encounters:  08/31/20 218 lb 12.8 oz (99.2 kg)  10/14/11 212 lb (96.2 kg)  09/30/11 210 lb 6.4 oz (95.4 kg)    Physical Exam Vitals and nursing note reviewed.  Constitutional:      General: He is not in acute distress.    Appearance: Normal appearance. He is not toxic-appearing.  HENT:  Head: Normocephalic and atraumatic.     Right Ear: Tympanic membrane, ear canal and external ear normal. There is no impacted cerumen.     Left Ear: Tympanic membrane, ear canal and external ear normal. There is no impacted cerumen.     Nose: Nose normal. No congestion.     Mouth/Throat:     Mouth: Mucous membranes are moist.     Pharynx: Oropharynx is clear. No oropharyngeal exudate or posterior oropharyngeal erythema.  Eyes:     General: No scleral icterus.       Right eye: No discharge.        Left eye: No discharge.     Extraocular Movements: Extraocular movements intact.     Pupils: Pupils are equal, round, and reactive to light.  Cardiovascular:     Rate and  Rhythm: Normal rate and regular rhythm.  Pulmonary:     Effort: Pulmonary effort is normal. No respiratory distress.     Breath sounds: Normal breath sounds. No wheezing, rhonchi or rales.  Abdominal:     General: Abdomen is flat. Bowel sounds are normal. There is no distension.     Palpations: Abdomen is soft.     Tenderness: There is no right CVA tenderness or left CVA tenderness.  Genitourinary:    Comments: Deferred using shared decision making Musculoskeletal:        General: Normal range of motion.     Cervical back: Normal range of motion and neck supple. No rigidity.     Right lower leg: No edema.     Left lower leg: No edema.  Lymphadenopathy:     Cervical: No cervical adenopathy.  Skin:    General: Skin is warm and dry.     Capillary Refill: Capillary refill takes less than 2 seconds.     Coloration: Skin is not jaundiced or pale.     Findings: No erythema.  Neurological:     Mental Status: He is alert and oriented to person, place, and time.     Motor: No weakness.     Gait: Gait normal.  Psychiatric:        Mood and Affect: Mood normal.        Behavior: Behavior normal.        Thought Content: Thought content normal.        Judgment: Judgment normal.      Assessment & Plan:   Problem List Items Addressed This Visit   None Visit Diagnoses     Encounter to establish care    -  Primary   Screening for lipid disorders       Relevant Orders   Lipid panel   Screening for diabetes mellitus       Relevant Orders   Hemoglobin A1c   Screening for metabolic disorder       Relevant Orders   COMPLETE METABOLIC PANEL WITH GFR   CBC with Differential/Platelet   Screening for prostate cancer       Relevant Orders   PSA        Follow up plan: Return for pending lab work.

## 2020-09-01 LAB — COMPLETE METABOLIC PANEL WITH GFR
AG Ratio: 1.3 (calc) (ref 1.0–2.5)
ALT: 15 U/L (ref 9–46)
AST: 15 U/L (ref 10–35)
Albumin: 4.1 g/dL (ref 3.6–5.1)
Alkaline phosphatase (APISO): 52 U/L (ref 35–144)
BUN: 17 mg/dL (ref 7–25)
CO2: 27 mmol/L (ref 20–32)
Calcium: 9.5 mg/dL (ref 8.6–10.3)
Chloride: 102 mmol/L (ref 98–110)
Creat: 1.2 mg/dL (ref 0.70–1.25)
GFR, Est African American: 74 mL/min/{1.73_m2} (ref >=60)
GFR, Est Non African American: 64 mL/min/{1.73_m2} (ref >=60)
Globulin: 3.2 g/dL (calc) (ref 1.9–3.7)
Glucose, Bld: 86 mg/dL (ref 65–99)
Potassium: 4.5 mmol/L (ref 3.5–5.3)
Sodium: 137 mmol/L (ref 135–146)
Total Bilirubin: 0.7 mg/dL (ref 0.2–1.2)
Total Protein: 7.3 g/dL (ref 6.1–8.1)

## 2020-09-01 LAB — HEMOGLOBIN A1C
Hgb A1c MFr Bld: 5.5 % of total Hgb (ref <5.7)
Mean Plasma Glucose: 111 mg/dL
eAG (mmol/L): 6.2 mmol/L

## 2020-09-01 LAB — CBC WITH DIFFERENTIAL/PLATELET
Absolute Monocytes: 402 cells/uL (ref 200–950)
Basophils Absolute: 22 cells/uL (ref 0–200)
Basophils Relative: 0.4 %
Eosinophils Absolute: 303 cells/uL (ref 15–500)
Eosinophils Relative: 5.5 %
HCT: 42.2 % (ref 38.5–50.0)
Hemoglobin: 14.1 g/dL (ref 13.2–17.1)
Lymphs Abs: 1502 cells/uL (ref 850–3900)
MCH: 30.2 pg (ref 27.0–33.0)
MCHC: 33.4 g/dL (ref 32.0–36.0)
MCV: 90.4 fL (ref 80.0–100.0)
MPV: 9.3 fL (ref 7.5–12.5)
Monocytes Relative: 7.3 %
Neutro Abs: 3273 cells/uL (ref 1500–7800)
Neutrophils Relative %: 59.5 %
Platelets: 299 10*3/uL (ref 140–400)
RBC: 4.67 10*6/uL (ref 4.20–5.80)
RDW: 13.4 % (ref 11.0–15.0)
Total Lymphocyte: 27.3 %
WBC: 5.5 10*3/uL (ref 3.8–10.8)

## 2020-09-01 LAB — LIPID PANEL
Cholesterol: 207 mg/dL — ABNORMAL HIGH (ref <200)
HDL: 40 mg/dL (ref >=40)
LDL Cholesterol (Calc): 149 mg/dL (calc) — ABNORMAL HIGH
Non-HDL Cholesterol (Calc): 167 mg/dL (calc) — ABNORMAL HIGH (ref <130)
Total CHOL/HDL Ratio: 5.2 (calc) — ABNORMAL HIGH (ref <5.0)
Triglycerides: 78 mg/dL (ref <150)

## 2020-09-01 LAB — PSA: PSA: 1.55 ng/mL (ref <=4.00)

## 2020-09-05 ENCOUNTER — Encounter: Payer: Self-pay | Admitting: Nurse Practitioner

## 2020-09-13 ENCOUNTER — Other Ambulatory Visit: Payer: Self-pay

## 2020-09-13 ENCOUNTER — Ambulatory Visit (INDEPENDENT_AMBULATORY_CARE_PROVIDER_SITE_OTHER): Payer: 59 | Admitting: Nurse Practitioner

## 2020-09-13 ENCOUNTER — Encounter: Payer: Self-pay | Admitting: Nurse Practitioner

## 2020-09-13 VITALS — BP 130/80 | HR 67 | Temp 98.3°F | Ht 74.0 in | Wt 220.6 lb

## 2020-09-13 DIAGNOSIS — E7841 Elevated Lipoprotein(a): Secondary | ICD-10-CM | POA: Diagnosis not present

## 2020-09-13 DIAGNOSIS — Z0001 Encounter for general adult medical examination with abnormal findings: Secondary | ICD-10-CM

## 2020-09-13 DIAGNOSIS — Z0000 Encounter for general adult medical examination without abnormal findings: Secondary | ICD-10-CM

## 2020-09-13 NOTE — Progress Notes (Signed)
BP 130/80   Pulse 67   Temp 98.3 F (36.8 C)   Ht 6\' 2"  (1.88 m)   Wt 220 lb 9.6 oz (100.1 kg)   SpO2 94%   BMI 28.32 kg/m    Subjective:    Patient ID: , male    DOB: 1955-10-07, 65 y.o.   MRN: [id number]  HPI: Tyler Austin is a 66 y.o. male presenting on 09/13/2020 for comprehensive medical examination. Current medical complaints include: none.    HYPERLIPIDEMIA Last LDL elevated above goal of less than 100.   Hyperlipidemia status:  not currently on treatment Satisfied with current treatment?  N/a Side effects:  n/a Medication compliance: n/a Past cholesterol meds: none Aspirin:  no The 10-year ASCVD risk score 09/15/2020 [name], et al., 2013) is: 10.6%   Values used to calculate the score:     Age: 10 years     Sex: Male     Is Non-Hispanic African American: Yes     Diabetic: No     Tobacco smoker: No     Systolic Blood Pressure: 130 mmHg     Is BP treated: No     HDL Cholesterol: 40 mg/dL     Total Cholesterol: 207 mg/dL Chest pain:  no Coronary artery disease:  no Family history CAD:  no Family history early CAD:  no   Depression Screen done today and results listed below:  Depression screen [facility] 2/9 09/13/2020 08/31/2020  Decreased Interest 0 0  Down, Depressed, Hopeless 0 0  PHQ - 2 Score 0 0    The patient does not have a history of falls. I did not complete a risk assessment for falls. A plan of care for falls was not documented.   Past Medical History:  Past Medical History:  Diagnosis Date   Urinary incontinence     Surgical History:  Past Surgical History:  Procedure Laterality Date   PROSTATE SURGERY  2009    Medications:  Current Outpatient Medications on File Prior to Visit  Medication Sig   calcium carbonate (OS-CAL) 600 MG TABS Take 600 mg by mouth daily.   glucosamine-chondroitin 500-400 MG tablet Take 1 tablet by mouth daily.   Multiple Vitamin (MULTIVITAMIN) tablet Take 1 tablet by mouth daily.   vitamin C (ASCORBIC  ACID) 500 MG tablet Take 500 mg by mouth daily.   vitamin E 400 UNIT capsule Take 400 Units by mouth daily.   No current facility-administered medications on file prior to visit.    Allergies:  No Known Allergies  Social History:  Social History   Socioeconomic History   Marital status: Married    Spouse name: Not on file   Number of children: Not on file   Years of education: Not on file   Highest education level: Not on file  Occupational History   Not on file  Tobacco Use   Smoking status: Never   Smokeless tobacco: Never  Substance and Sexual Activity   Alcohol use: No   Drug use: No   Sexual activity: Yes    Partners: Female  Other Topics Concern   Not on file  Social History Narrative   Not on file   Social Determinants of Health   Financial Resource Strain: Not on file  Food Insecurity: Not on file  Transportation Needs: Not on file  Physical Activity: Not on file  Stress: Not on file  Social Connections: Not on file  Intimate Partner Violence: Not on file  Social History   Tobacco Use  Smoking Status Never  Smokeless Tobacco Never   Social History   Substance and Sexual Activity  Alcohol Use No    Family History:  Family History  Problem Relation Age of Onset   Hypertension Mother    Syncope episode Father    Breast cancer Sister     Past medical history, surgical history, medications, allergies, family history and social history reviewed with patient today and changes made to appropriate areas of the chart.   Review of Systems  Constitutional: Negative.   HENT: Negative.    Eyes: Negative.   Respiratory: Negative.    Cardiovascular: Negative.   Gastrointestinal: Negative.   Genitourinary: Negative.   Musculoskeletal: Negative.   Skin: Negative.   Neurological: Negative.   Psychiatric/Behavioral: Negative.        Objective:    BP 130/80   Pulse 67   Temp 98.3 F (36.8 C)   Ht 6\' 2"  (1.88 m)   Wt 220 lb 9.6 oz (100.1 kg)    SpO2 94%   BMI 28.32 kg/m   Wt Readings from Last 3 Encounters:  09/13/20 220 lb 9.6 oz (100.1 kg)  08/31/20 218 lb 12.8 oz (99.2 kg)  10/14/11 212 lb (96.2 kg)    Physical Exam Constitutional:      General: He is not in acute distress.    Appearance: Normal appearance. He is normal weight. He is not toxic-appearing.  HENT:     Head: Normocephalic and atraumatic.     Right Ear: Tympanic membrane, ear canal and external ear normal. There is no impacted cerumen.     Left Ear: Tympanic membrane, ear canal and external ear normal. There is no impacted cerumen.     Nose: Nose normal. No congestion.     Mouth/Throat:     Mouth: Mucous membranes are moist.     Pharynx: Oropharynx is clear. No oropharyngeal exudate or posterior oropharyngeal erythema.  Eyes:     General: No scleral icterus.    Extraocular Movements: Extraocular movements intact.     Pupils: Pupils are equal, round, and reactive to light.  Neck:     Vascular: No carotid bruit.  Cardiovascular:     Rate and Rhythm: Normal rate and regular rhythm.     Heart sounds: Normal heart sounds. No murmur heard.   No gallop.  Pulmonary:     Effort: Pulmonary effort is normal. No respiratory distress.     Breath sounds: Normal breath sounds. No wheezing or rhonchi.  Abdominal:     General: Abdomen is flat. Bowel sounds are normal. There is no distension.     Palpations: Abdomen is soft.     Tenderness: There is no abdominal tenderness.  Genitourinary:    Comments: Deferred using shared decision making Musculoskeletal:        General: No swelling or tenderness. Normal range of motion.     Cervical back: Normal range of motion and neck supple. No tenderness.     Right lower leg: No edema.     Left lower leg: No edema.  Skin:    General: Skin is warm and dry.     Capillary Refill: Capillary refill takes less than 2 seconds.     Coloration: Skin is not jaundiced or pale.     Findings: No lesion or rash.  Neurological:      General: No focal deficit present.     Mental Status: He is alert and oriented to person, place,  and time. Mental status is at baseline.  Psychiatric:        Mood and Affect: Mood normal.        Behavior: Behavior normal.        Thought Content: Thought content normal.        Judgment: Judgment normal.    Results for orders placed or performed in visit on 08/31/20  Lipid panel  Result Value Ref Range   Cholesterol 207 (H) <200 mg/dL   HDL 40 > OR = 40 mg/dL   Triglycerides 78 <025 mg/dL   LDL Cholesterol (Calc) 149 (H) mg/dL (calc)   Total CHOL/HDL Ratio 5.2 (H) <5.0 (calc)   Non-HDL Cholesterol (Calc) 167 (H) <130 mg/dL (calc)  Hemoglobin E5I  Result Value Ref Range   Hgb A1c MFr Bld 5.5 <5.7 % of total Hgb   Mean Plasma Glucose 111 mg/dL   eAG (mmol/L) 6.2 mmol/L  COMPLETE METABOLIC PANEL WITH GFR  Result Value Ref Range   Glucose, Bld 86 65 - 99 mg/dL   BUN 17 7 - 25 mg/dL   Creat 7.78 2.42 - 3.53 mg/dL   GFR, Est Non African American 64 > OR = 60 mL/min/1.67m2   GFR, Est African American 74 > OR = 60 mL/min/1.34m2   BUN/Creatinine Ratio NOT APPLICABLE 6 - 22 (calc)   Sodium 137 135 - 146 mmol/L   Potassium 4.5 3.5 - 5.3 mmol/L   Chloride 102 98 - 110 mmol/L   CO2 27 20 - 32 mmol/L   Calcium 9.5 8.6 - 10.3 mg/dL   Total Protein 7.3 6.1 - 8.1 g/dL   Albumin 4.1 3.6 - 5.1 g/dL   Globulin 3.2 1.9 - 3.7 g/dL (calc)   AG Ratio 1.3 1.0 - 2.5 (calc)   Total Bilirubin 0.7 0.2 - 1.2 mg/dL   Alkaline phosphatase (APISO) 52 35 - 144 U/L   AST 15 10 - 35 U/L   ALT 15 9 - 46 U/L  PSA  Result Value Ref Range   PSA 1.55 < OR = 4.00 ng/mL  CBC with Differential/Platelet  Result Value Ref Range   WBC 5.5 3.8 - 10.8 Thousand/uL   RBC 4.67 4.20 - 5.80 Million/uL   Hemoglobin 14.1 13.2 - 17.1 g/dL   HCT 61.4 43.1 - 54.0 %   MCV 90.4 80.0 - 100.0 fL   MCH 30.2 27.0 - 33.0 pg   MCHC 33.4 32.0 - 36.0 g/dL   RDW 08.6 76.1 - 95.0 %   Platelets 299 140 - 400 Thousand/uL   MPV 9.3  7.5 - 12.5 fL   Neutro Abs 3,273 1,500 - 7,800 cells/uL   Lymphs Abs 1,502 850 - 3,900 cells/uL   Absolute Monocytes 402 200 - 950 cells/uL   Eosinophils Absolute 303 15 - 500 cells/uL   Basophils Absolute 22 0 - 200 cells/uL   Neutrophils Relative % 59.5 %   Total Lymphocyte 27.3 %   Monocytes Relative 7.3 %   Eosinophils Relative 5.5 %   Basophils Relative 0.4 %      Assessment & Plan:   Problem List Items Addressed This Visit       Other   Elevated lipoprotein(a)    Discussed elevated LDL and The 10-year ASCVD risk score Denman George [name], et al., 2013) is: 10.6%   Values used to calculate the score:     Age: 47 years     Sex: Male     Is Non-Hispanic African American: Yes  Diabetic: No     Tobacco smoker: No     Systolic Blood Pressure: 130 mmHg     Is BP treated: No     HDL Cholesterol: 40 mg/dL     Total Cholesterol: 207 mg/dL I recommended statin since risk score is >7.5%.  I explained that statins help reduce the cardiovascular risk significantly and help prevent against heart attack and stroke.  Patient declines for now and wishes to work on dietary and lifestyle changes for 6 months first.  Will recheck fasting lipids in 6 months.       Other Visit Diagnoses     Annual physical exam    -  Primary        Discussed aspirin prophylaxis for myocardial infarction prevention and decision was it was not indicated  LABORATORY TESTING:  Health maintenance labs ordered today as discussed above.    IMMUNIZATIONS:   - Tdap: Tetanus vaccination status reviewed: last tetanus booster within 10 years. - Influenza: Refused - Pneumovax: Not applicable - Prevnar: Not applicable - HPV: Not applicable - Zostavax vaccine:  patient is going to think about it - COVID-19 vaccine: fully vaccinated, does not have card  SCREENING: - Colonoscopy: Up to date  Discussed with patient purpose of the colonoscopy is to detect colon cancer at curable precancerous or early stages   -  AAA Screening: Not applicable  -Hearing Test: Not applicable  -Spirometry: Not applicable   PATIENT COUNSELING:    Sexuality: Discussed sexually transmitted diseases, partner selection, use of condoms, avoidance of unintended pregnancy  and contraceptive alternatives.   Advised to avoid cigarette smoking.  I discussed with the patient that most people either abstain from alcohol or drink within safe limits (<=14/week and <=4 drinks/occasion for males, <=7/weeks and <= 3 drinks/occasion for females) and that the risk for alcohol disorders and other health effects rises proportionally with the number of drinks per week and how often a drinker exceeds daily limits.  Discussed cessation/primary prevention of drug use and availability of treatment for abuse.   Diet: Encouraged to adjust caloric intake to maintain  or achieve ideal body weight, to reduce intake of dietary saturated fat and total fat, to limit sodium intake by avoiding high sodium foods and not adding table salt, and to maintain adequate dietary potassium and calcium preferably from fresh fruits, vegetables, and low-fat dairy products.    stressed the importance of regular exercise  Injury prevention: Discussed safety belts, safety helmets, smoke detector, smoking near bedding or upholstery.   Dental health: Discussed importance of regular tooth brushing, flossing, and dental visits.   Follow up plan: NEXT PREVENTATIVE PHYSICAL DUE IN 1 YEAR. Return in about 6 months (around 03/15/2021) for hld with fasting labs.

## 2020-09-13 NOTE — Assessment & Plan Note (Signed)
Discussed elevated LDL and The 10-year ASCVD risk score Denman George [name], et al., 2013) is: 10.6%   Values used to calculate the score:     Age: 65 years     Sex: Male     Is Non-Hispanic African American: Yes     Diabetic: No     Tobacco smoker: No     Systolic Blood Pressure: 130 mmHg     Is BP treated: No     HDL Cholesterol: 40 mg/dL     Total Cholesterol: 207 mg/dL I recommended statin since risk score is >7.5%.  I explained that statins help reduce the cardiovascular risk significantly and help prevent against heart attack and stroke.  Patient declines for now and wishes to work on dietary and lifestyle changes for 6 months first.  Will recheck fasting lipids in 6 months.

## 2021-03-15 ENCOUNTER — Other Ambulatory Visit: Payer: Self-pay

## 2021-03-15 ENCOUNTER — Ambulatory Visit (INDEPENDENT_AMBULATORY_CARE_PROVIDER_SITE_OTHER): Payer: 59 | Admitting: Nurse Practitioner

## 2021-03-15 ENCOUNTER — Encounter: Payer: Self-pay | Admitting: Nurse Practitioner

## 2021-03-15 VITALS — BP 124/88 | HR 71 | Ht 74.0 in | Wt 227.0 lb

## 2021-03-15 DIAGNOSIS — E7841 Elevated Lipoprotein(a): Secondary | ICD-10-CM

## 2021-03-15 NOTE — Progress Notes (Signed)
Subjective:    Patient ID: Chrys Racer, male    DOB: 1955-11-07, 65 y.o.   MRN: [id number]  HPI: Newell Wafer is a 65 y.o. male presenting for follow up.   Chief Complaint  Patient presents with   Hyperlipidemia   HYPERLIPIDEMIA Has been trying to avoid ice cream.  Eats red meat less than once per week.  Not very much fried.  Eats dairy weekly. Hyperlipidemia status: stable Satisfied with current treatment?  Not currently on treatment.  Side effects:  n/a Medication compliance: n/a Past cholesterol meds: none Aspirin:  no The 10-year ASCVD risk score (Arnett DK, et al., 2019) is: 9.8%   Values used to calculate the score:     Age: 34 years     Sex: Male     Is Non-Hispanic African American: Yes     Diabetic: No     Tobacco smoker: No     Systolic Blood Pressure: 124 mmHg     Is BP treated: No     HDL Cholesterol: 40 mg/dL     Total Cholesterol: 207 mg/dL Chest pain:  no Myalgias: no Coronary artery disease:  no Family history CAD:  no Family history early CAD:  no LDL goal: less than 100   Dinner -has been eating chicken and potatoes this week.   Does not eat breakfast or lunch.  He is still staying active with basketball.  No Known Allergies  Outpatient Encounter Medications as of 03/15/2021  Medication Sig   calcium carbonate (OS-CAL) 600 MG TABS Take 600 mg by mouth daily.   glucosamine-chondroitin 500-400 MG tablet Take 1 tablet by mouth daily.   Multiple Vitamin (MULTIVITAMIN) tablet Take 1 tablet by mouth daily.   vitamin C (ASCORBIC ACID) 500 MG tablet Take 500 mg by mouth daily.   vitamin E 400 UNIT capsule Take 400 Units by mouth daily.   No facility-administered encounter medications on file as of 03/15/2021.    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 Interim medical history since our last visit reviewed.  Review of Systems  Constitutional: Negative.   Respiratory: Negative.  Negative for chest tightness, shortness of breath and wheezing.   Cardiovascular: Negative.  Negative for chest pain, palpitations and leg swelling.  Musculoskeletal: Negative.   Neurological: Negative.   Psychiatric/Behavioral: Negative.    Per HPI unless specifically indicated above     Objective:    BP 124/88    Pulse 71    Ht 6\' 2"  (1.88 m)    Wt 227 lb (103 kg)    SpO2 97%    BMI 29.15 kg/m   Wt Readings from Last 3 Encounters:  03/15/21 227 lb (103 kg)  09/13/20 220 lb 9.6 oz (100.1 kg)  08/31/20 218 lb 12.8 oz (99.2 kg)    Physical Exam Vitals and nursing note reviewed.  Constitutional:      General: He is not in acute distress.    Appearance: Normal appearance. He is not toxic-appearing.  Neck:     Vascular: No carotid bruit.  Cardiovascular:     Rate and Rhythm: Normal rate and regular rhythm.     Heart sounds: Normal heart sounds. No murmur heard. Pulmonary:     Effort: Pulmonary effort is normal. No respiratory distress.  Breath sounds: Normal breath sounds. No wheezing, rhonchi or rales.  Musculoskeletal:     Cervical back: Normal range of motion.  Skin:    General: Skin is warm and dry.     Coloration: Skin is not jaundiced or pale.     Findings: No erythema.  Neurological:     Mental Status: He is alert and oriented to person, place, and time.  Psychiatric:        Mood and Affect: Mood normal.        Behavior: Behavior normal.        Thought Content: Thought content normal.        Judgment: Judgment normal.      Assessment & Plan:   Problem List Items Addressed This Visit       Other   Elevated lipoprotein(a) - Primary    Recheck lipids today - patient is fasting.  Discussed goal LDL less than 100.  Discussed dietary and lifestyle changes to help with cholesterol.  Follow up 6 months.       Relevant Orders   Lipid Panel     Follow up plan: Return in about 6 months (around 09/13/2021) for CPE with fasting labs.

## 2021-03-15 NOTE — Assessment & Plan Note (Signed)
Recheck lipids today - patient is fasting.  Discussed goal LDL less than 100.  Discussed dietary and lifestyle changes to help with cholesterol.  Follow up 6 months.

## 2021-03-16 LAB — LIPID PANEL
Cholesterol: 188 mg/dL (ref <200)
HDL: 45 mg/dL (ref >=40)
LDL Cholesterol (Calc): 127 mg/dL (calc) — ABNORMAL HIGH
Non-HDL Cholesterol (Calc): 143 mg/dL (calc) — ABNORMAL HIGH (ref <130)
Total CHOL/HDL Ratio: 4.2 (calc) (ref <5.0)
Triglycerides: 72 mg/dL (ref <150)

## 2021-09-13 ENCOUNTER — Ambulatory Visit: Payer: 59 | Admitting: Nurse Practitioner

## 2021-12-02 ENCOUNTER — Encounter: Payer: Self-pay | Admitting: Internal Medicine
